# Patient Record
Sex: Male | Born: 1950 | Race: Black or African American | Hispanic: No | Marital: Single | Smoking: Never smoker
Health system: Southern US, Community
[De-identification: ages and names within clinical notes are randomized; demographics above are authoritative.]

## PROBLEM LIST (undated history)

## (undated) HISTORY — PX: CYST EXCISION: SHX5701

---

## 2019-10-15 ENCOUNTER — Emergency Department
Admission: EM | Admit: 2019-10-15 | Discharge: 2019-10-15 | Disposition: A | Discharge status: Home / Self Care | Payer: Medicare Other | Attending: Student | Admitting: Student

## 2019-10-15 ENCOUNTER — Emergency Department: Payer: Medicare Other

## 2019-10-15 ENCOUNTER — Encounter: Payer: Self-pay | Admitting: Emergency Medicine

## 2019-10-15 ENCOUNTER — Other Ambulatory Visit: Payer: Self-pay

## 2019-10-15 DIAGNOSIS — M25511 Pain in right shoulder: Secondary | ICD-10-CM | POA: Insufficient documentation

## 2019-10-15 DIAGNOSIS — M5412 Radiculopathy, cervical region: Secondary | ICD-10-CM

## 2019-10-15 DIAGNOSIS — M50122 Cervical disc disorder at C5-C6 level with radiculopathy: Secondary | ICD-10-CM | POA: Diagnosis not present

## 2019-10-15 MED ORDER — CYCLOBENZAPRINE HCL 5 MG PO TABS: 5.0000 mg | ORAL_TABLET | Freq: Three times a day (TID) | ORAL | 0 refills | Status: AC | PRN

## 2019-10-15 MED ORDER — ORPHENADRINE CITRATE 30 MG/ML IJ SOLN
60.0000 mg | Freq: Two times a day (BID) | INTRAMUSCULAR | Status: DC
  Administered 2019-10-15: 60 mg via INTRAMUSCULAR
  Filled 2019-10-15: qty 2

## 2019-10-15 MED ORDER — METHYLPREDNISOLONE SODIUM SUCC 125 MG IJ SOLR
125.0000 mg | Freq: Once | INTRAMUSCULAR | Status: AC
  Administered 2019-10-15: 11:00:00 125 mg via INTRAMUSCULAR
  Filled 2019-10-15: qty 2

## 2019-10-15 MED ORDER — TRAMADOL HCL 50 MG PO TABS: 50.0000 mg | ORAL_TABLET | Freq: Four times a day (QID) | ORAL | 0 refills | Status: AC | PRN

## 2019-10-15 MED ORDER — METHYLPREDNISOLONE 4 MG PO TBPK: ORAL_TABLET | ORAL | 0 refills | Status: AC

## 2019-10-15 MED ORDER — LIDOCAINE 5 % EX PTCH
1.0000 | MEDICATED_PATCH | CUTANEOUS | Status: DC
  Administered 2019-10-15: 10:00:00 1 via TRANSDERMAL
  Filled 2019-10-15: qty 1

## 2019-10-15 NOTE — ED Triage Notes (Signed)
Pt woke up a few mornings ago with a pain from neck down arm.  Thinks has a pinched nerve because if pushes down on a certain part of muscle it relieves the pain.  Has tried heating pad but no relief.  Moved from [place] yesterday but had woke with pain two days before. Ambulatory NAD.  No medical hx.

## 2019-10-15 NOTE — ED Provider Notes (Signed)
[facility] Emergency Department Provider Note   ____________________________________________   First MD Initiated Contact with Patient 10/15/19 [phone]     (approximate)  I have reviewed the triage vital signs and the nursing notes.   HISTORY  Chief Complaint Arm Pain    HPI Bobby Roman is a 69 y.o. male patient complain of 3 days of superior right shoulder pain.  Patient also stated pain radiating from his neck to his shoulder.  Patient stated no relief with heating pads and over-the-counter pain patches.  Patient did pain decreased with putting pressure on the muscle of the upper arm.  Patient states decreased range of motion with overhead reaching and abduction.  Patient denies any provocative incident for complaint.  Patient is right-hand dominant.  Patient rates pain as 4/10.  Patient described the pain as "achy".         History reviewed. No pertinent past medical history.  There are no problems to display for this patient.   Past Surgical History:  Procedure Laterality Date  . CYST EXCISION      Prior to Admission medications   Medication Sig Start Date End Date Taking? Authorizing Provider  cyclobenzaprine (FLEXERIL) 5 MG tablet Take 1 tablet (5 mg total) by mouth 3 (three) times daily as needed for muscle spasms. 10/15/19   Sable Feil, PA-C  methylPREDNISolone (MEDROL DOSEPAK) 4 MG TBPK tablet Take Tapered dose as directed 10/15/19   Sable Feil, PA-C  traMADol (ULTRAM) 50 MG tablet Take 1 tablet (50 mg total) by mouth every 6 (six) hours as needed. 10/15/19 10/14/20  Sable Feil, PA-C    Allergies Patient has no known allergies.  History reviewed. No pertinent family history.  Social History Social History   Tobacco Use  . Smoking status: Never Smoker  . Smokeless tobacco: Never Used  Substance Use Topics  . Alcohol use: Never  . Drug use: Never    Review of Systems Constitutional: No fever/chills Eyes: No  visual changes. ENT: No sore throat. Cardiovascular: Denies chest pain. Respiratory: Denies shortness of breath. Gastrointestinal: No abdominal pain.  No nausea, no vomiting.  No diarrhea.  No constipation. Genitourinary: Negative for dysuria. Musculoskeletal: Neck and right shoulder pain.. Skin: Negative for rash. Neurological: Negative for headaches, focal weakness or numbness.   ____________________________________________   PHYSICAL EXAM:  VITAL SIGNS: ED Triage Vitals  Enc Vitals Group     BP 10/15/19 1006 126/73     Pulse Rate 10/15/19 1006 66     Resp 10/15/19 1006 18     Temp 10/15/19 1006 98.4 F (36.9 C)     Temp Source 10/15/19 1006 Oral     SpO2 10/15/19 1006 100 %     Weight 10/15/19 1003 180 lb (81.6 kg)     Height 10/15/19 1003 6\' 1"  (1.854 m)     Head Circumference --      Peak Flow --      Pain Score 10/15/19 1003 4     Pain Loc --      Pain Edu? --      Excl. in [place]? --    Constitutional: Alert and oriented. Well appearing and in no acute distress. Neck: No stridor.  No cervical spine tenderness to palpation. Hematological/Lymphatic/Immunilogical: No cervical lymphadenopathy. Cardiovascular: Normal rate, regular rhythm. Grossly normal heart sounds.  Good peripheral circulation. Respiratory: Normal respiratory effort.  No retractions. Lungs CTAB. Musculoskeletal: No obvious deformity to the right shoulder.  Patient has moderate guarding  palpation superior posterior aspect of the shoulder.  Patient has full equal range of motion of the neck.  Patient has decreased range of motion with adduction overhead reaching with the right shoulder.   Neurologic:  Normal speech and language. No gross focal neurologic deficits are appreciated. No gait instability. Skin:  Skin is warm, dry and intact. No rash noted. Psychiatric: Mood and affect are normal. Speech and behavior are normal.  ____________________________________________   LABS (all labs ordered are listed,  but only abnormal results are displayed)  Labs Reviewed - No data to display ____________________________________________  EKG   ____________________________________________  RADIOLOGY  ED MD interpretation:    Official radiology report(s): DG Cervical Spine 2-3 Views  Result Date: 10/15/2019 CLINICAL DATA:  69 year old male with a history of shoulder pain EXAM: CERVICAL SPINE - 2-3 VIEW COMPARISON:  None. FINDINGS: Cervical Spine: Cervical elements maintain relative anatomic alignment from the level of C1-T1. Unremarkable appearance of the craniocervical junction. No subluxation, anterolisthesis, retrolisthesis. No acute fracture line identified. Vertebral body heights maintained. Early endplate changes of the C2-C5 level. Disc space narrowing with endplate sclerosis, anterior osteophyte production, and uncovertebral joint disease at C5-C6 and C6-C7. Lesser degree of narrowing and endplate changes at C7-T1. Facet disease bilaterally most pronounced in the lower cervical spine. Open mouth odontoid view unremarkable Prevertebral soft tissues within normal limits. IMPRESSION: Negative for acute fracture or malalignment of the cervical spine. Degenerative changes of C5-C7. Electronically Signed   By: Gilmer Mor D.O.   On: 10/15/2019 11:05   DG Shoulder Right  Result Date: 10/15/2019 CLINICAL DATA:  69 year old male with a history of shoulder pain EXAM: RIGHT SHOULDER - 2+ VIEW COMPARISON:  None. FINDINGS: There is no evidence of fracture or dislocation. Mild degenerative changes at the acromioclavicular joint. IMPRESSION: Negative for acute bony abnormality. Mild degenerative changes at the [facility] joint Electronically Signed   By: Gilmer Mor D.O.   On: 10/15/2019 11:03    ____________________________________________   PROCEDURES  Procedure(s) performed (including Critical Care):  Procedures   ____________________________________________   INITIAL IMPRESSION / ASSESSMENT AND PLAN /  ED COURSE  As part of my medical decision making, I reviewed the following data within the electronic MEDICAL RECORD NUMBER     Patient presents increasing right shoulder pain.  Discussed x-ray findings with patient showing degenerative changes of the cervical spine and right shoulder.  Patient given discharge care instruction advised take medication as directed.  Patient advised to follow-up with home station PCP for continued care.   Daqwan Dougal was evaluated in Emergency Department on 10/15/2019 for the symptoms described in the history of present illness. He was evaluated in the context of the global COVID-19 pandemic, which necessitated consideration that the patient might be at risk for infection with the SARS-CoV-2 virus that causes COVID-19. Institutional protocols and algorithms that pertain to the evaluation of patients at risk for COVID-19 are in a state of rapid change based on information released by regulatory bodies including the CDC and federal and state organizations. These policies and algorithms were followed during the patient's care in the ED.       ____________________________________________   FINAL CLINICAL IMPRESSION(S) / ED DIAGNOSES  Final diagnoses:  Acute pain of right shoulder  Cervical radiculopathy at C6     ED Discharge Orders         Ordered    traMADol (ULTRAM) 50 MG tablet  Every 6 hours PRN     10/15/19 1123    methylPREDNISolone (  MEDROL DOSEPAK) 4 MG TBPK tablet     10/15/19 1123    cyclobenzaprine (FLEXERIL) 5 MG tablet  3 times daily PRN     10/15/19 1123           Note:  This document was prepared using Dragon voice recognition software and may include unintentional dictation errors.    Joni Reining, PA-C 10/15/19 1126    Miguel Aschoff., MD 10/16/19 1901

## 2019-10-15 NOTE — Discharge Instructions (Addendum)
Follow discharge care instruction take medication as directed.  Follow-up with home station PCP.

## 2019-10-15 NOTE — ED Notes (Signed)
Pt c/o right shoulder pain x1 week now, feeling like pins and needles. Pt denies any injury to his right shoulder.

## 2021-04-03 IMAGING — CR DG CERVICAL SPINE 2 OR 3 VIEWS
4 series · 4 of 4 positions shown · non-contrast
Comparison: None.

CLINICAL DATA: 69-year-old male with a history of shoulder pain

EXAM:
CERVICAL SPINE - 2-3 VIEW

[c-spine lat]
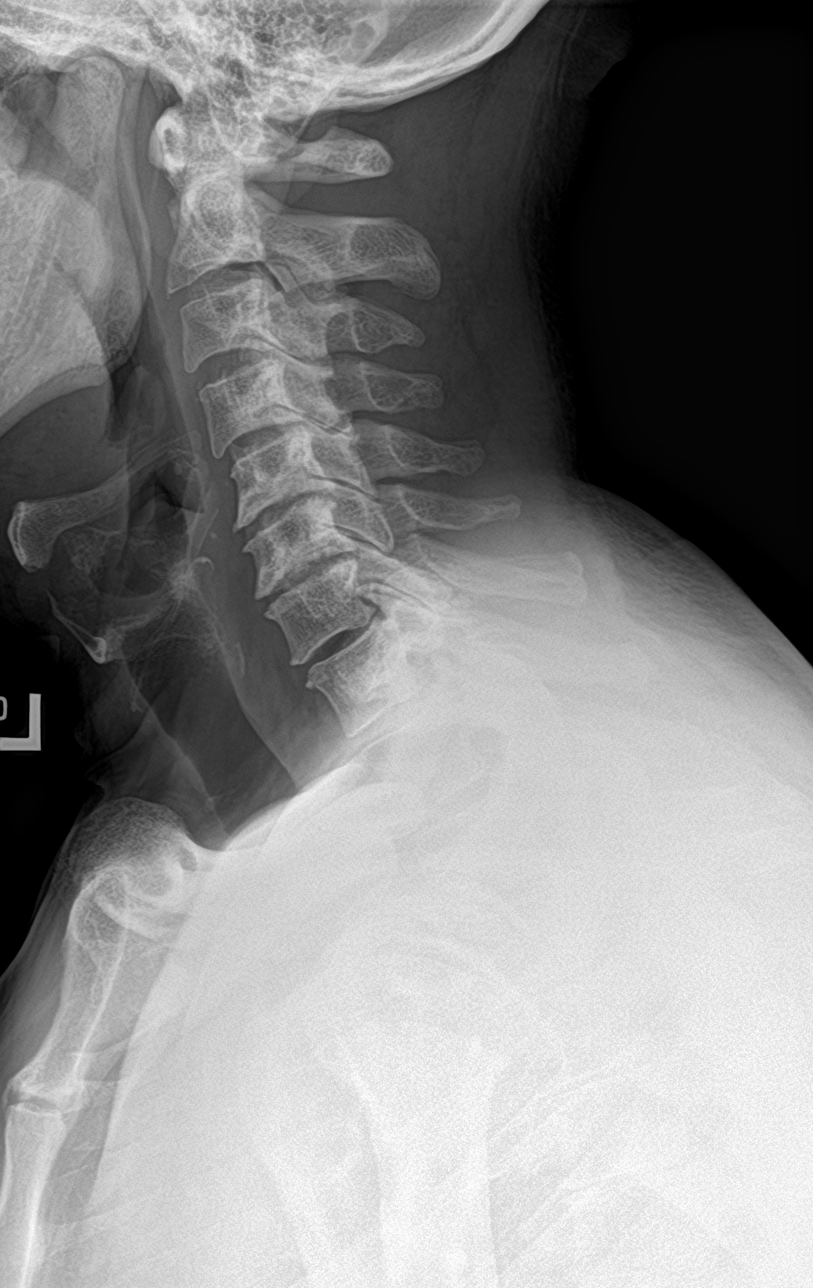

[c-spine ap]
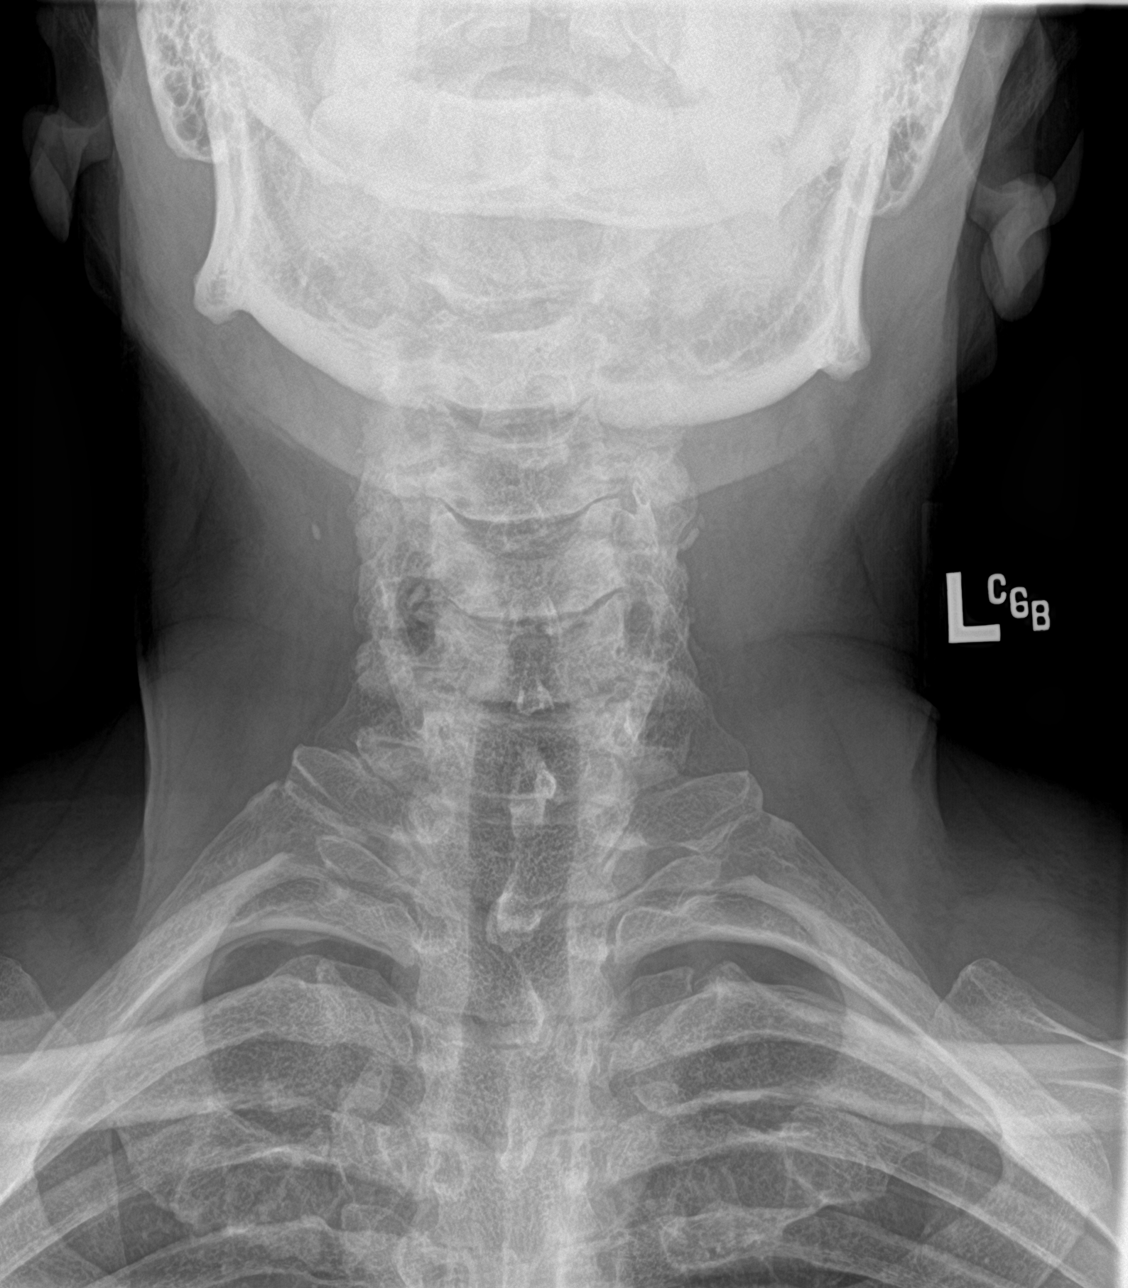

[c-spine open mouth]
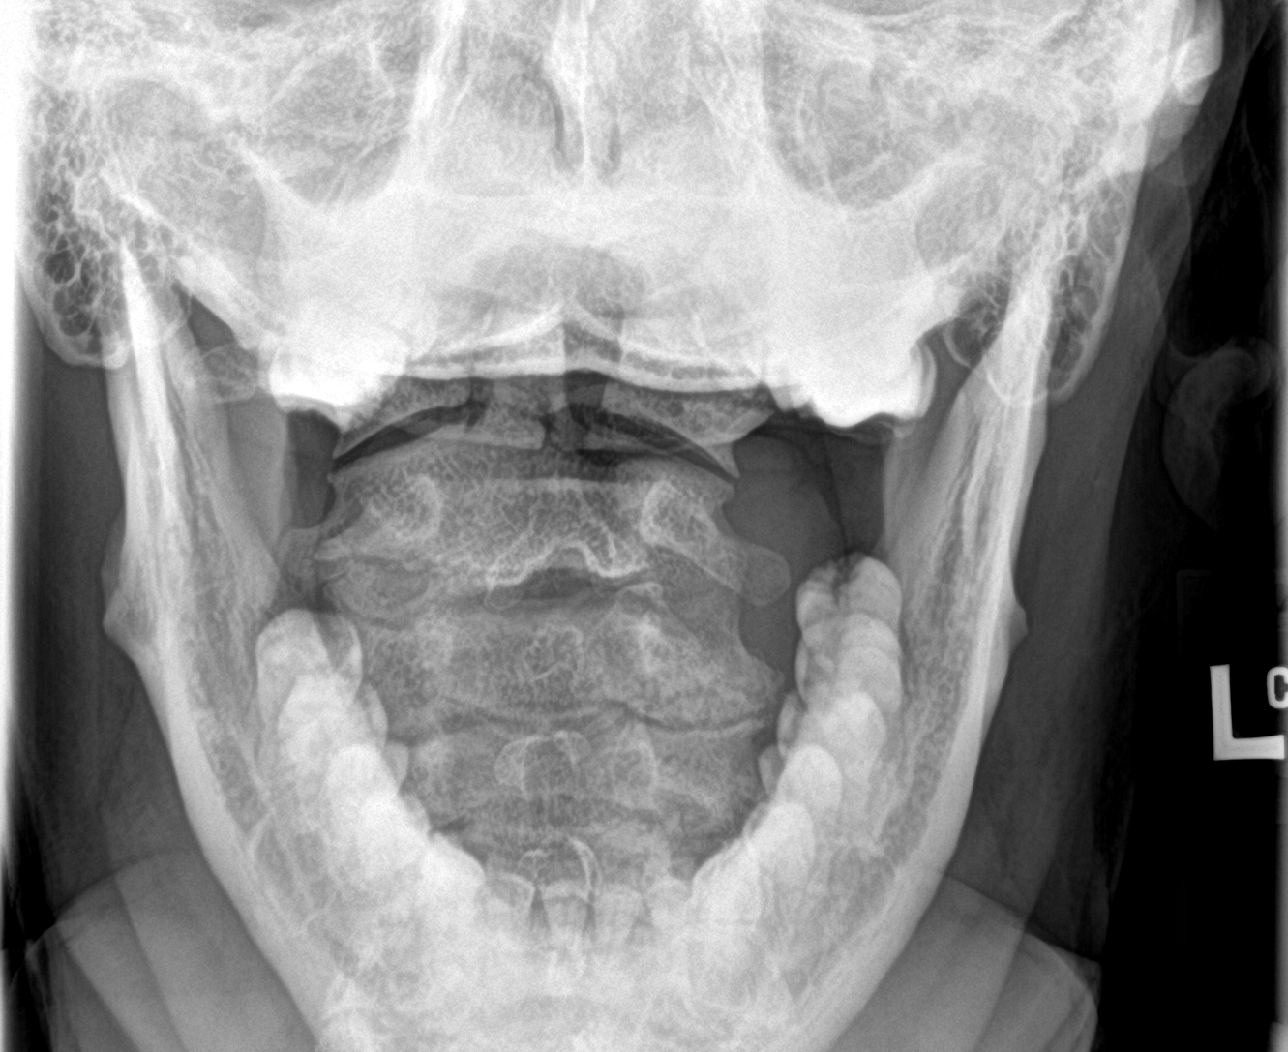

[c-spine swimmers]
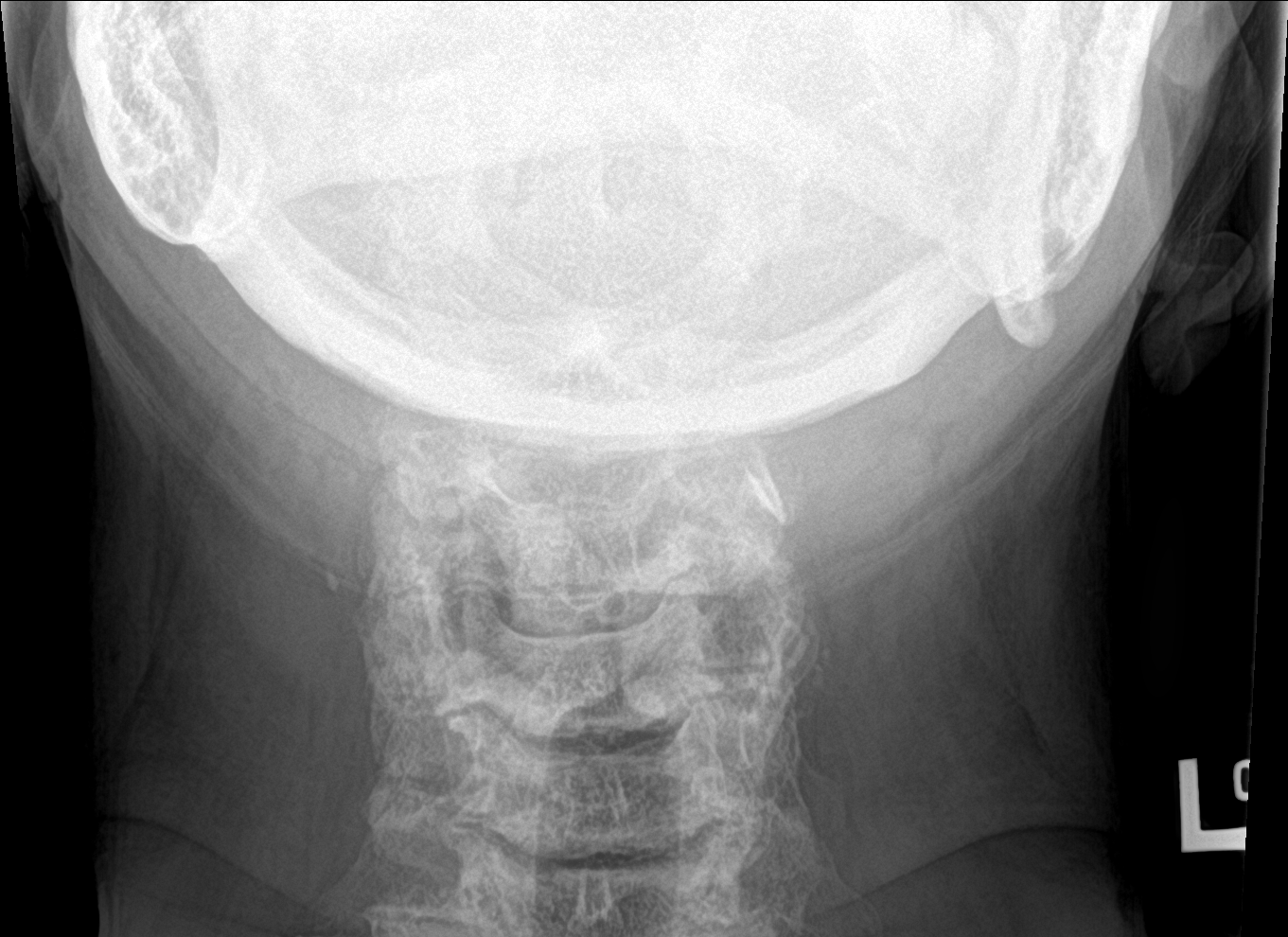

[4 of 4 positions shown; findings below may reference images not displayed]

FINDINGS: Cervical Spine:

Cervical elements maintain relative anatomic alignment from the
level of C1-T1. Unremarkable appearance of the craniocervical
junction.

No subluxation, anterolisthesis, retrolisthesis.

No acute fracture line identified.

Vertebral body heights maintained.

Early endplate changes of the C2-C5 level.

Disc space narrowing with endplate sclerosis, anterior osteophyte
production, and uncovertebral joint disease at C5-C6 and C6-C7.
Lesser degree of narrowing and endplate changes at C7-T1.

Facet disease bilaterally most pronounced in the lower cervical
spine.

Open mouth odontoid view unremarkable

Prevertebral soft tissues within normal limits.
IMPRESSION: Negative for acute fracture or malalignment of the cervical spine.

Degenerative changes of C5-C7.
